# Patient Record
Sex: Female | Born: 1970 | Race: Black or African American | Hispanic: No | Marital: Married | Smoking: Never smoker
Health system: Southern US, Community
[De-identification: ages and names within clinical notes are randomized; demographics above are authoritative.]

## PROBLEM LIST (undated history)

## (undated) DIAGNOSIS — J45909 Unspecified asthma, uncomplicated: Secondary | ICD-10-CM

## (undated) DIAGNOSIS — B20 Human immunodeficiency virus [HIV] disease: Secondary | ICD-10-CM

## (undated) HISTORY — PX: VAGINAL HYSTERECTOMY: SUR661

---

## 2000-07-19 ENCOUNTER — Other Ambulatory Visit: Admission: RE | Admit: 2000-07-19 | Discharge: 2000-07-19 | Payer: Self-pay | Admitting: Emergency Medicine

## 2001-04-04 ENCOUNTER — Encounter: Payer: Self-pay | Admitting: Emergency Medicine

## 2001-04-04 ENCOUNTER — Encounter: Admission: RE | Admit: 2001-04-04 | Discharge: 2001-04-04 | Payer: Self-pay | Admitting: Emergency Medicine

## 2001-11-18 ENCOUNTER — Emergency Department (HOSPITAL_COMMUNITY): Admission: EM | Admit: 2001-11-18 | Discharge: 2001-11-18 | Payer: Self-pay | Admitting: Emergency Medicine

## 2001-11-19 ENCOUNTER — Emergency Department (HOSPITAL_COMMUNITY): Admission: EM | Admit: 2001-11-19 | Discharge: 2001-11-20 | Payer: Self-pay | Admitting: Emergency Medicine

## 2003-04-22 ENCOUNTER — Emergency Department (HOSPITAL_COMMUNITY): Admission: EM | Admit: 2003-04-22 | Discharge: 2003-04-22 | Payer: Self-pay | Admitting: Emergency Medicine

## 2003-05-28 ENCOUNTER — Other Ambulatory Visit: Admission: RE | Admit: 2003-05-28 | Discharge: 2003-05-28 | Payer: Self-pay | Admitting: Family Medicine

## 2003-07-25 ENCOUNTER — Emergency Department (HOSPITAL_COMMUNITY): Admission: EM | Admit: 2003-07-25 | Discharge: 2003-07-25 | Payer: Self-pay | Admitting: Emergency Medicine

## 2004-05-23 ENCOUNTER — Other Ambulatory Visit: Admission: RE | Admit: 2004-05-23 | Discharge: 2004-05-23 | Payer: Self-pay | Admitting: Family Medicine

## 2004-12-13 ENCOUNTER — Encounter: Admission: RE | Admit: 2004-12-13 | Discharge: 2004-12-13 | Payer: Self-pay | Admitting: Family Medicine

## 2005-08-14 ENCOUNTER — Ambulatory Visit: Payer: Self-pay | Admitting: Gastroenterology

## 2005-08-15 ENCOUNTER — Ambulatory Visit: Payer: Self-pay | Admitting: Gastroenterology

## 2005-09-18 ENCOUNTER — Ambulatory Visit: Payer: Self-pay | Admitting: Gastroenterology

## 2007-04-16 ENCOUNTER — Other Ambulatory Visit: Admission: RE | Admit: 2007-04-16 | Discharge: 2007-04-16 | Payer: Self-pay | Admitting: Endocrinology

## 2007-07-01 ENCOUNTER — Ambulatory Visit: Payer: Self-pay | Admitting: Gastroenterology

## 2007-07-02 ENCOUNTER — Ambulatory Visit (HOSPITAL_COMMUNITY): Admission: RE | Admit: 2007-07-02 | Discharge: 2007-07-02 | Payer: Self-pay | Admitting: Gastroenterology

## 2007-09-02 ENCOUNTER — Encounter: Payer: Self-pay | Admitting: Physician Assistant

## 2007-09-02 ENCOUNTER — Telehealth: Payer: Self-pay | Admitting: Gastroenterology

## 2007-09-02 ENCOUNTER — Ambulatory Visit: Payer: Self-pay | Admitting: Internal Medicine

## 2007-09-02 DIAGNOSIS — R1013 Epigastric pain: Secondary | ICD-10-CM

## 2007-09-02 DIAGNOSIS — R110 Nausea: Secondary | ICD-10-CM

## 2007-09-02 DIAGNOSIS — J45909 Unspecified asthma, uncomplicated: Secondary | ICD-10-CM | POA: Insufficient documentation

## 2007-09-04 ENCOUNTER — Ambulatory Visit (HOSPITAL_COMMUNITY): Admission: RE | Admit: 2007-09-04 | Discharge: 2007-09-04 | Payer: Self-pay | Admitting: Internal Medicine

## 2007-09-05 ENCOUNTER — Telehealth: Payer: Self-pay | Admitting: Physician Assistant

## 2007-09-10 ENCOUNTER — Telehealth: Payer: Self-pay | Admitting: Gastroenterology

## 2007-09-10 LAB — CONVERTED CEMR LAB
ALT: 20 units/L (ref 0–35)
Albumin: 3.9 g/dL (ref 3.5–5.2)
BUN: 7 mg/dL (ref 6–23)
GFR calc Af Amer: 81 mL/min
GFR calc non Af Amer: 67 mL/min
Total Bilirubin: 0.8 mg/dL (ref 0.3–1.2)
Total Protein: 8 g/dL (ref 6.0–8.3)

## 2007-10-01 ENCOUNTER — Encounter: Payer: Self-pay | Admitting: Gastroenterology

## 2007-10-30 ENCOUNTER — Encounter (INDEPENDENT_AMBULATORY_CARE_PROVIDER_SITE_OTHER): Payer: Self-pay | Admitting: General Surgery

## 2007-10-30 ENCOUNTER — Ambulatory Visit (HOSPITAL_COMMUNITY): Admission: RE | Admit: 2007-10-30 | Discharge: 2007-10-31 | Payer: Self-pay | Admitting: General Surgery

## 2007-11-11 ENCOUNTER — Encounter: Payer: Self-pay | Admitting: Gastroenterology

## 2008-01-07 ENCOUNTER — Emergency Department (HOSPITAL_COMMUNITY): Admission: EM | Admit: 2008-01-07 | Discharge: 2008-01-08 | Payer: Self-pay | Admitting: Emergency Medicine

## 2008-02-13 ENCOUNTER — Ambulatory Visit: Payer: Self-pay | Admitting: Psychiatry

## 2008-02-13 ENCOUNTER — Inpatient Hospital Stay (HOSPITAL_COMMUNITY): Admission: RE | Admit: 2008-02-13 | Discharge: 2008-02-14 | Payer: Self-pay | Admitting: Psychiatry

## 2008-02-13 ENCOUNTER — Emergency Department (HOSPITAL_COMMUNITY): Admission: EM | Admit: 2008-02-13 | Discharge: 2008-02-13 | Payer: Self-pay | Admitting: Emergency Medicine

## 2008-02-16 ENCOUNTER — Emergency Department: Payer: Self-pay | Admitting: Emergency Medicine

## 2008-05-25 ENCOUNTER — Emergency Department (HOSPITAL_COMMUNITY): Admission: EM | Admit: 2008-05-25 | Discharge: 2008-05-25 | Payer: Self-pay | Admitting: Emergency Medicine

## 2008-08-17 ENCOUNTER — Emergency Department: Payer: Self-pay | Admitting: Emergency Medicine

## 2009-01-20 ENCOUNTER — Other Ambulatory Visit: Admission: RE | Admit: 2009-01-20 | Discharge: 2009-01-20 | Payer: Self-pay | Admitting: Family Medicine

## 2009-11-02 ENCOUNTER — Ambulatory Visit (HOSPITAL_COMMUNITY): Admission: RE | Admit: 2009-11-02 | Discharge: 2009-11-02 | Payer: Self-pay | Admitting: Psychiatry

## 2009-11-09 ENCOUNTER — Other Ambulatory Visit (HOSPITAL_COMMUNITY): Admission: RE | Admit: 2009-11-09 | Discharge: 2009-11-26 | Payer: Self-pay | Admitting: Psychiatry

## 2009-11-18 ENCOUNTER — Ambulatory Visit: Payer: Self-pay | Admitting: Psychiatry

## 2010-08-02 LAB — COMPREHENSIVE METABOLIC PANEL
BUN: 11 mg/dL (ref 6–23)
CO2: 29 mEq/L (ref 19–32)
Calcium: 8.8 mg/dL (ref 8.4–10.5)
Chloride: 105 mEq/L (ref 96–112)
Creatinine, Ser: 0.84 mg/dL (ref 0.4–1.2)
GFR calc non Af Amer: >60 mL/min (ref >60)
Total Bilirubin: 0.6 mg/dL (ref 0.3–1.2)

## 2010-08-02 LAB — CBC
HCT: 37.6 % (ref 36.0–46.0)
MCHC: 33 g/dL (ref 30.0–36.0)
MCV: 88.4 fL (ref 78.0–100.0)
Platelets: 302 10*3/uL (ref 150–400)
WBC: 7.2 10*3/uL (ref 4.0–10.5)

## 2010-08-02 LAB — DIFFERENTIAL
Basophils Absolute: 0 10*3/uL (ref 0.0–0.1)
Lymphocytes Relative: 22 % (ref 12–46)
Lymphs Abs: 1.6 10*3/uL (ref 0.7–4.0)
Neutro Abs: 4.8 10*3/uL (ref 1.7–7.7)

## 2010-08-02 LAB — LIPASE, BLOOD: Lipase: 22 U/L (ref 11–59)

## 2010-08-30 NOTE — Assessment & Plan Note (Signed)
[facility]                         GASTROENTEROLOGY OFFICE NOTE   NAME:Lauren Salinas, Lauren Salinas                    MRN:          [id number]  DATE:07/01/2007                            DOB:          1970-09-09    Mrs. Lauren Salinas has had worsening problems with epigastric pain.  She had  an episode of nausea and vomiting about 2 weeks ago, but this was self  limited.  She has remained on Excedrin and Aleve.  She states she has  been taking Aleve more frequently due to worsening problems with back  pain.  She notes worsening epigastric pain with the regular use of  Excedrin and Aleve.  She has a history of gastritis on endoscopy  approximately 2 years ago.  She has no dysphagia, odynophagia, melena,  or hematochezia.  Helicobacter pylori antibody in June 2007 was  negative.   CURRENT MEDICATIONS:  Listed on the chart, updated, and reviewed.   MEDICATION ALLERGIES:  None known.   EXAM:  Obese African-American female in no acute distress.  Weight 209.8 pounds, blood pressure 110/80, pulse 86 and regular.  HEENT:  Anicteric sclerae, oropharynx clear.  CHEST:  Clear to auscultation bilaterally.  CARDIAC:  Regular rate and rhythm without murmurs.  ABDOMEN:  Soft with mild epigastric tenderness to deep palpation.  No  rebound or guarding.  No palpable organomegaly, masses, or hernias.  Normoactive bowel sounds.  RECTAL:  Examination deferred with the recent exam performed by Lou Miner, NP, showing no lesions and Hemoccult negative stool.   ASSESSMENT AND PLAN:  Worsening epigastric pain.  I suspect she has  recurrent gastritis likely related to aspirin and NSAID usage.  She is  to minimize or avoid all aspirin and NSAID, and change to Tylenol.  Further treatment of her back pain and other medical problems per her  primary care physician.  Increase omeprazole to 20 mg p.o. b.i.d.  Schedule abdominal ultrasound to rule out cholelithiasis.  Return office  visit with me in 4 to 6 weeks.     Venita Lick. Russella Dar, MD, [facility]  Electronically Signed    MTS/MedQ  DD: 07/01/2007  DT: 07/01/2007  Job #: [id number]   cc:   Lou Miner, NP, [facility] @ [facility]
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
## 2010-08-30 NOTE — Op Note (Signed)
Lauren Salinas, Lauren Salinas             ACCOUNT NO.:  [id number]   MEDICAL RECORD NO.:  [id number]          PATIENT TYPE:  AMB   LOCATION:  DAY                          FACILITY:  [facility]   PHYSICIAN:  Adolph Pollack, M.D.DATE OF BIRTH:  07-Jan-1971   DATE OF PROCEDURE:  10/30/2007  DATE OF DISCHARGE:                               OPERATIVE REPORT   PREOPERATIVE DIAGNOSIS:  Biliary dyskinesia.   POSTOPERATIVE DIAGNOSIS:  Biliary dyskinesia.   PROCEDURE:  Laparoscopic cholecystectomy with intraoperative  cholangiogram.   SURGEON:  Adolph Pollack, M.D.   ASSISTANT:  Leonie Man, M.D.   ANESTHESIA:  General.   INDICATIONS:  This is a 40 year old female who is having epigastric  pressure-type pain, nausea specifically after eating up.  She had been  on double dose proton pump inhibitors and had to take some Aleve.  She  stopped her Aleve and despite being on the proton pump inhibitors she  continues to have the postprandial pressure-type pain.  She has  gallbladder dysfunction consistent with biliary dyskinesia and now  presents for cholecystectomy.  The procedure, risks and success rate  were discussed with her preoperatively.   TECHNIQUE:  She was seen in the holding area and then brought to the  operating room, placed supine on the operating table and a general  anesthetic was administered.  The abdominal wall was sterilely prepped  and draped.  Marcaine solution was infiltrated in the subumbilical  region.  A previous small transverse subumbilical incision was reincised  through the skin and subcutaneous tissue until the midline fascia was  identified.  An incision was made in the midline fascia and peritoneum  entering the peritoneal cavity under direct vision.  A pursestring  suture of 0 Vicryl was placed around the fascial edges.  A Hasson trocar  was introduced to the peritoneal cavity and pneumoperitoneum created by  insufflation of CO2 gas.   Next the laparoscope  was introduced.  She was then placed in the reverse  Trendelenburg position with the right side tilted slightly up.  An 11-mm  trocar was placed through an epigastric incision and two 5 mm trocars  placed in the right mid lateral abdomen.   Following this the fundus of the gallbladder was grasped and no acute  inflammatory changes were noted.  There appeared to be some adhesions  between the dome of the liver and the diaphragm.  They were very thin.  I then grasped the infundibulum, retracted it laterally and mobilized  the infundibulum staying close to the gallbladder.  The cystic duct was  identified as well as the cystic artery that was adjacent to it.  I  separated these two with blunt dissection and created windows around  both of them.  A clip was placed at the cystic duct gallbladder junction  and a small incision made in the cystic duct.  A cholangiocatheter was  passed through the anterior abdominal wall into the cystic duct and  cholangiogram was performed.   Under real time fluoroscopy dilute contrast was injected into the cystic  duct which was of moderate length.  Common hepatic, right  and left  hepatic and common bile ducts opacified with contrast drained into the  duodenum without obvious evidence of obstruction.  Final reports pending  the radiologist's interpretation.   The cholangiocatheter was removed, the cystic duct was then clipped  three times on the biliary side and divided.  The cystic artery was then  clipped and divided.  The gallbladder was dissected free from liver  using electrocautery.  The posterior branch of the cystic artery was  identified and it was clipped.  The gallbladder then placed in Endopouch  bag.   The gallbladder fossa was copiously irrigated with saline solution.  There was no bleeding or bile leak noted.  The gallbladder was then  removed in the Endopouch bag through the subumbilical port.  The  subumbilical fascial defect was closed by  tightening up and tying down  the pursestring suture.  Remaining irrigation fluid was evacuated.  Trocars were removed and the pneumoperitoneum was released.   Skin incisions were closed with 4-0 Monocryl subcuticular stitches  followed by Steri-Strips and a sterile dressing.  She tolerated the  procedure well without any apparent complications and was taken to the  recovery in satisfactory condition.      Adolph Pollack, M.D.  Electronically Signed     TJR/MEDQ  D:  10/30/2007  T:  10/30/2007  Job:  [id number]   cc:   Venita Lick. Russella Dar, MD, FACG  [address]  [place]  [place] [address]

## 2011-01-12 LAB — DIFFERENTIAL
Eosinophils Absolute: 0.2
Eosinophils Relative: 3
Lymphs Abs: 2.8

## 2011-01-12 LAB — COMPREHENSIVE METABOLIC PANEL
ALT: 16
AST: 16
Alkaline Phosphatase: 56
CO2: 28
Calcium: 9.2
Chloride: 99
GFR calc Af Amer: >60
GFR calc non Af Amer: 55 — ABNORMAL LOW
Potassium: 4.3
Sodium: 134 — ABNORMAL LOW
Total Bilirubin: 0.8

## 2011-01-12 LAB — CBC
RBC: 4.75
WBC: 7.6

## 2011-01-12 LAB — PREGNANCY, URINE: Preg Test, Ur: NEGATIVE

## 2011-01-16 LAB — DIFFERENTIAL
Eosinophils Absolute: 0.2
Eosinophils Relative: 3
Lymphs Abs: 3.5
Monocytes Relative: 9

## 2011-01-16 LAB — CBC
HCT: 38.6
Hemoglobin: 12.8
MCV: 87
RBC: 4.44
WBC: 7.6

## 2011-01-16 LAB — BASIC METABOLIC PANEL
BUN: 9
Chloride: 104
GFR calc Af Amer: >60
Potassium: 3.3 — ABNORMAL LOW

## 2011-01-16 LAB — URINALYSIS, ROUTINE W REFLEX MICROSCOPIC
Glucose, UA: NEGATIVE
Ketones, ur: NEGATIVE
Protein, ur: NEGATIVE

## 2011-01-16 LAB — URINE MICROSCOPIC-ADD ON

## 2011-01-17 LAB — DIFFERENTIAL
Basophils Relative: 1
Eosinophils Relative: 4
Lymphocytes Relative: 50 — ABNORMAL HIGH
Monocytes Relative: 23 — ABNORMAL HIGH
Neutro Abs: 0.7 — ABNORMAL LOW
Neutrophils Relative %: 22 — ABNORMAL LOW

## 2011-01-17 LAB — URINE MICROSCOPIC-ADD ON

## 2011-01-17 LAB — URINALYSIS, ROUTINE W REFLEX MICROSCOPIC
Glucose, UA: NEGATIVE
Ketones, ur: NEGATIVE
Protein, ur: NEGATIVE

## 2011-01-17 LAB — POCT I-STAT, CHEM 8
BUN: 7
Chloride: 104
Creatinine, Ser: 1.3 — ABNORMAL HIGH
Sodium: 141
TCO2: 27

## 2011-01-17 LAB — RAPID URINE DRUG SCREEN, HOSP PERFORMED
Cocaine: NOT DETECTED
Opiates: POSITIVE — AB

## 2011-01-17 LAB — PATHOLOGIST SMEAR REVIEW

## 2011-01-17 LAB — POCT PREGNANCY, URINE: Preg Test, Ur: NEGATIVE

## 2011-01-17 LAB — CBC
Hemoglobin: 13.2
RBC: 4.59
WBC: 3.3 — ABNORMAL LOW

## 2013-11-08 ENCOUNTER — Emergency Department (HOSPITAL_COMMUNITY): Payer: BC Managed Care – PPO

## 2013-11-08 ENCOUNTER — Other Ambulatory Visit (HOSPITAL_COMMUNITY): Payer: BC Managed Care – PPO

## 2013-11-08 ENCOUNTER — Emergency Department (HOSPITAL_COMMUNITY)
Admission: EM | Admit: 2013-11-08 | Discharge: 2013-11-08 | Disposition: A | Discharge status: Home / Self Care | Payer: BC Managed Care – PPO | Attending: Emergency Medicine | Admitting: Emergency Medicine

## 2013-11-08 ENCOUNTER — Encounter (HOSPITAL_COMMUNITY): Payer: Self-pay | Admitting: Emergency Medicine

## 2013-11-08 DIAGNOSIS — R079 Chest pain, unspecified: Secondary | ICD-10-CM | POA: Insufficient documentation

## 2013-11-08 DIAGNOSIS — M25519 Pain in unspecified shoulder: Secondary | ICD-10-CM | POA: Insufficient documentation

## 2013-11-08 DIAGNOSIS — R748 Abnormal levels of other serum enzymes: Secondary | ICD-10-CM | POA: Insufficient documentation

## 2013-11-08 DIAGNOSIS — J45909 Unspecified asthma, uncomplicated: Secondary | ICD-10-CM | POA: Insufficient documentation

## 2013-11-08 DIAGNOSIS — R1011 Right upper quadrant pain: Secondary | ICD-10-CM

## 2013-11-08 DIAGNOSIS — M549 Dorsalgia, unspecified: Secondary | ICD-10-CM | POA: Insufficient documentation

## 2013-11-08 DIAGNOSIS — Z79899 Other long term (current) drug therapy: Secondary | ICD-10-CM | POA: Insufficient documentation

## 2013-11-08 DIAGNOSIS — Z21 Asymptomatic human immunodeficiency virus [HIV] infection status: Secondary | ICD-10-CM | POA: Insufficient documentation

## 2013-11-08 DIAGNOSIS — R1012 Left upper quadrant pain: Secondary | ICD-10-CM | POA: Insufficient documentation

## 2013-11-08 HISTORY — DX: Unspecified asthma, uncomplicated: J45.909

## 2013-11-08 HISTORY — DX: Human immunodeficiency virus (HIV) disease: B20

## 2013-11-08 LAB — CBC WITH DIFFERENTIAL/PLATELET
BASOS ABS: 0 10*3/uL (ref 0.0–0.1)
Basophils Relative: 0 % (ref 0–1)
EOS ABS: 0.1 10*3/uL (ref 0.0–0.7)
EOS PCT: 1 % (ref 0–5)
HEMATOCRIT: 39 % (ref 36.0–46.0)
Hemoglobin: 12.8 g/dL (ref 12.0–15.0)
Lymphocytes Relative: 26 % (ref 12–46)
Lymphs Abs: 2.2 10*3/uL (ref 0.7–4.0)
MCH: 29.6 pg (ref 26.0–34.0)
MCHC: 32.8 g/dL (ref 30.0–36.0)
MCV: 90.3 fL (ref 78.0–100.0)
MONO ABS: 0.5 10*3/uL (ref 0.1–1.0)
Monocytes Relative: 6 % (ref 3–12)
Neutro Abs: 5.4 10*3/uL (ref 1.7–7.7)
Neutrophils Relative %: 67 % (ref 43–77)
PLATELETS: 239 10*3/uL (ref 150–400)
RBC: 4.32 MIL/uL (ref 3.87–5.11)
RDW: 12.5 % (ref 11.5–15.5)
WBC: 8.2 10*3/uL (ref 4.0–10.5)

## 2013-11-08 LAB — COMPREHENSIVE METABOLIC PANEL
ALT: 57 U/L — AB (ref 0–35)
AST: 98 U/L — AB (ref 0–37)
Albumin: 3.7 g/dL (ref 3.5–5.2)
Alkaline Phosphatase: 80 U/L (ref 39–117)
Anion gap: 14 (ref 5–15)
BUN: 11 mg/dL (ref 6–23)
CALCIUM: 9.1 mg/dL (ref 8.4–10.5)
CO2: 24 mEq/L (ref 19–32)
CREATININE: 0.95 mg/dL (ref 0.50–1.10)
Chloride: 101 mEq/L (ref 96–112)
GFR, EST AFRICAN AMERICAN: 85 mL/min — AB (ref >90)
GFR, EST NON AFRICAN AMERICAN: 73 mL/min — AB (ref >90)
GLUCOSE: 122 mg/dL — AB (ref 70–99)
Potassium: 3.5 mEq/L — ABNORMAL LOW (ref 3.7–5.3)
SODIUM: 139 meq/L (ref 137–147)
TOTAL PROTEIN: 7.5 g/dL (ref 6.0–8.3)
Total Bilirubin: 0.4 mg/dL (ref 0.3–1.2)

## 2013-11-08 LAB — LIPASE, BLOOD: LIPASE: 17 U/L (ref 11–59)

## 2013-11-08 LAB — AMYLASE: Amylase: 40 U/L (ref 0–105)

## 2013-11-08 MED ORDER — HYDROMORPHONE HCL PF 1 MG/ML IJ SOLN
0.5000 mg | Freq: Once | INTRAMUSCULAR | Status: AC
  Administered 2013-11-08: 0.05 mg via INTRAVENOUS
  Filled 2013-11-08: qty 1

## 2013-11-08 MED ORDER — IOHEXOL 300 MG/ML  SOLN
25.0000 mL | INTRAMUSCULAR | Status: DC | PRN
  Administered 2013-11-08: 25 mL via ORAL

## 2013-11-08 MED ORDER — OXYCODONE-ACETAMINOPHEN 5-325 MG PO TABS
1.0000 | ORAL_TABLET | Freq: Once | ORAL | Status: DC
  Filled 2013-11-08: qty 1

## 2013-11-08 MED ORDER — ONDANSETRON HCL 4 MG/2ML IJ SOLN
4.0000 mg | Freq: Once | INTRAMUSCULAR | Status: AC
  Administered 2013-11-08: 4 mg via INTRAVENOUS
  Filled 2013-11-08: qty 2

## 2013-11-08 MED ORDER — ONDANSETRON HCL 4 MG/2ML IJ SOLN
4.0000 mg | Freq: Once | INTRAMUSCULAR | Status: DC
Start: 2013-11-08 — End: 2013-11-08
  Filled 2013-11-08: qty 2

## 2013-11-08 MED ORDER — PANTOPRAZOLE SODIUM 40 MG IV SOLR
40.0000 mg | Freq: Once | INTRAVENOUS | Status: AC
  Administered 2013-11-08: 40 mg via INTRAVENOUS
  Filled 2013-11-08: qty 40

## 2013-11-08 MED ORDER — HYDROMORPHONE HCL PF 1 MG/ML IJ SOLN
1.0000 mg | Freq: Once | INTRAMUSCULAR | Status: AC
  Administered 2013-11-08: 1 mg via INTRAVENOUS
  Filled 2013-11-08: qty 1

## 2013-11-08 MED ORDER — ONDANSETRON HCL 4 MG PO TABS: 4.0000 mg | ORAL_TABLET | Freq: Four times a day (QID) | ORAL | Status: AC

## 2013-11-08 MED ORDER — HYDROMORPHONE HCL PF 1 MG/ML IJ SOLN
1.0000 mg | Freq: Once | INTRAMUSCULAR | Status: AC | PRN
  Administered 2013-11-08: 1 mg via INTRAVENOUS
  Filled 2013-11-08: qty 1

## 2013-11-08 MED ORDER — IOHEXOL 300 MG/ML  SOLN
100.0000 mL | Freq: Once | INTRAMUSCULAR | Status: AC | PRN
  Administered 2013-11-08: 100 mL via INTRAVENOUS

## 2013-11-08 MED ORDER — OXYCODONE HCL 5 MG PO TABA: 5.0000 mg | ORAL_TABLET | Freq: Four times a day (QID) | ORAL | Status: AC | PRN

## 2013-11-08 NOTE — ED Notes (Signed)
Pt. Stated, I was awken by abd. Pain that hurt so bad went to my chest, shoulder and back around 0500

## 2013-11-08 NOTE — ED Notes (Signed)
CT reports CD of scan is not quite ready but will call us when it its.
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
## 2013-11-08 NOTE — ED Provider Notes (Signed)
Medical screening examination/treatment/procedure(s) were performed by non-physician practitioner and as supervising physician I was immediately available for consultation/collaboration.   EKG Interpretation None        Joya Gaskinsonald W Marda Breidenbach, MD 11/08/13 [phone]

## 2013-11-08 NOTE — Discharge Instructions (Signed)
DO NOT TAKE MEDICATIONS THAT HAVE TYLENOL (ACETOMINPHEN) IN IT.  Liver Profile A liver profile is a battery of tests which helps your caregiver evaluate your liver function. The following tests are often included in the liver profile: Alanine aminotransferase (ALT or SGPT) This is an enzyme found primarily in the liver. Abnormalities may represent liver disease. This is found in cells of the liver so when it is elevated, it has been released by damaged cells. Albumin - The serum albumin is one of the major proteins in the blood and a reflection of the general state of nutrition. This is low when the liver is unable to do its job. It is also low when protein is lost in the urine. NORMAL FINDINGS Adult/Elderly  Total protein: 6.4-8.3 g/dL or 82-9564-83 g/L (SI units)  Albumin: 3.5-5 g/dL or 62-1335-50 g/L (SI units)  Globulin: 2.3-3.4 g/dL  Alpha1 globulin: 0.8-6.50.1-0.3 g/dL or 1-3 g/L (SI units)  Alpha2 globulin: 0.6-1 g/dL or 7-846-10 g/L (SI units)  Beta globulin: 0.7-1.1 g/dL or 6-967-11 g/L (SI units) Children  Total protein  Premature infant: 4.2-7.6 g/dL  Newborn: 2.9-5.24.6-7.4 g/dL  Infant: 8-4.16-6.7 g/dL  Child: 3.2-46.2-8 g/dL  Albumin  Premature infant: 3-4.2 g/dL  Newborn: 4.0-1.03.5-5.4 g/dL  Infant: 2.7-2.54.4-5.4 g/dL  Child: 3-6.64-5.9 g/dL Albumin/Globulin ratio - Calculated by dividing the albumin by the globulin. It is a measure of well being.  Alkaline phosphatase - This is an enzyme which is important in diagnosing proper bone and liver functions. NORMAL FINDINGS Age / Normal Value (units/L)  0-5 days / 35-140  Less than 3 yr / 15-60  3-6 yr / 15-50  6-12 yr / 10-50  12-18 yr / 10-40  Adult / 0-35 units/L or 0-0.58 microKat/L (SI Units) (Females tend to have slightly lower levels than males)  Elderly / Slightly higher than adults Aspartate aminotransferase (AST or SGOT) - an enzyme found in skeletal and heart muscle, liver and other organs. Abnormalities may represent liver disease. This is  found in cells of the liver so when it is elevated, it has been released by damaged cells. Bilirubin, Total: A chemical involved with liver functions. High concentrations may result in jaundice. Jaundice is a yellowing of the skin and the whites of the eyes. NORMAL FINDINGS Blood  Adult/elderly/child  Total bilirubin: 0.3-1.0 mg/dL or 4.4-035.1-17 micromole/L (SI units)  Indirect bilirubin: 0.2-0.8 mg/dL or 4.7-42.53.4-12.0 micromole/L (SI units)  Direct bilirubin: 0.1-0.3 mg/dL or 9.5-6.31.7-5.1 micromole/L (SI units)  Newborn total bilirubin: 1.0-12.0 mg/dL or 87.5-64317.1-205 micromole/L (SI units)  Urine0-0.02 mg/dL Ranges for normal findings may vary among different laboratories and hospitals. You should always check with your doctor after having lab work or other tests done to discuss the meaning of your test results and whether your values are considered within normal limits PREPARATION FOR TEST No preparation or fasting is necessary unless you have been informed otherwise. A blood sample is obtained by inserting a needle into a vein in the arm. MEANING OF TEST  Your caregiver will go over the test results with you and discuss the importance and meaning of your results, as well as treatment options and the need for additional tests if necessary. OBTAINING THE TEST RESULTS It is your responsibility to obtain your test results. Ask the lab or department performing the test when and how you will get your results. Document Released: 05/06/2004 Document Revised: 06/26/2011 Document Reviewed: 08/05/2013 [facility] Patient Information 2015 [place], [place]. This information is not intended to replace advice given to you  by your health care provider. Make sure you discuss any questions you have with your health care provider. ° °

## 2013-11-08 NOTE — ED Notes (Signed)
Pt finished drinking contrast. CT notified. 

## 2013-11-08 NOTE — ED Notes (Signed)
Tiffany, PA at bedside.

## 2014-10-05 IMAGING — CT CT ABD-PELV W/ CM
2 of 5 series · 11 of 46 positions shown, 12 images · IV contrast (Iodine)
Comparison: 07/02/2007

CLINICAL DATA: Severe abdominal pain, HIV, asthma, prior
hysterectomy

EXAM:
CT ABDOMEN AND PELVIS WITH CONTRAST
TECHNIQUE: Multidetector CT imaging of the abdomen and pelvis was performed
using the standard protocol following bolus administration of
intravenous contrast.
CONTRAST:  100mL OMNIPAQUE IOHEXOL 300 MG/ML  SOLN

[Series 201: routine, idose (2) · axial · 0.73mm/px · z∈[-468,-43]mm · 8 of 105 slices shown, 9 images]
[im 10/105  soft-tissue]
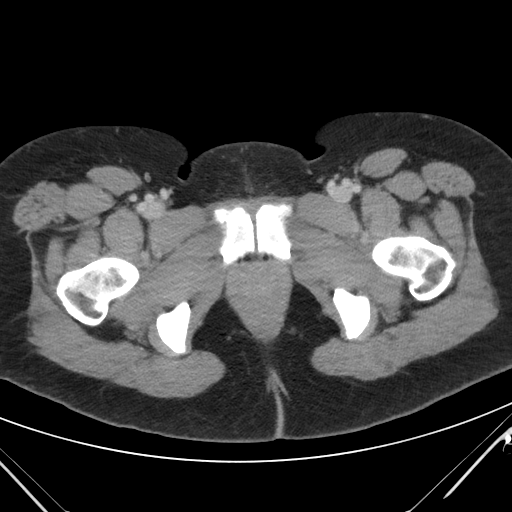
[im 10/105  bone]
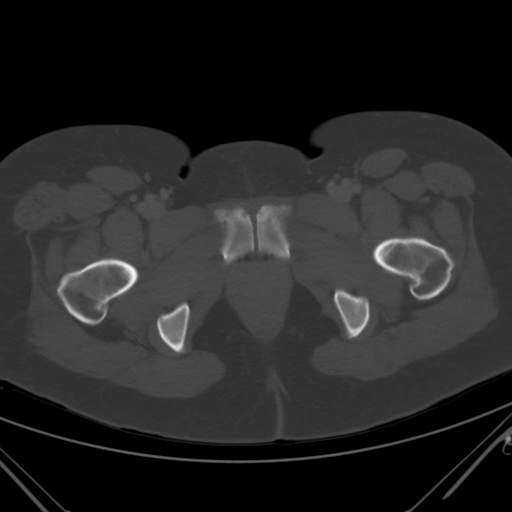
[im 20/105  soft-tissue]
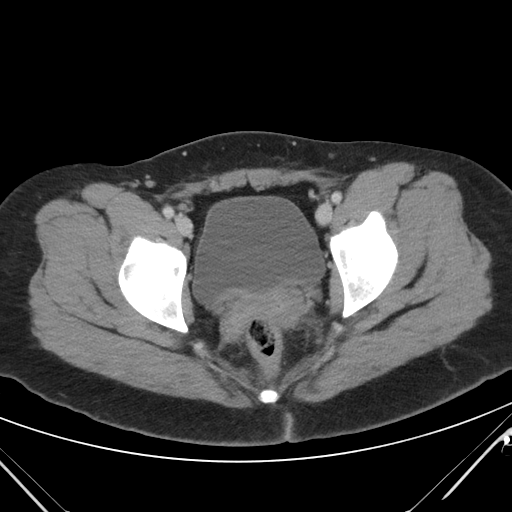
[im 35/105  soft-tissue]
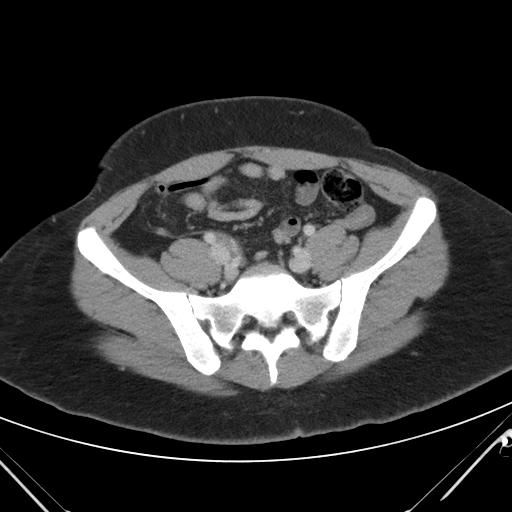
[im 45/105  soft-tissue]
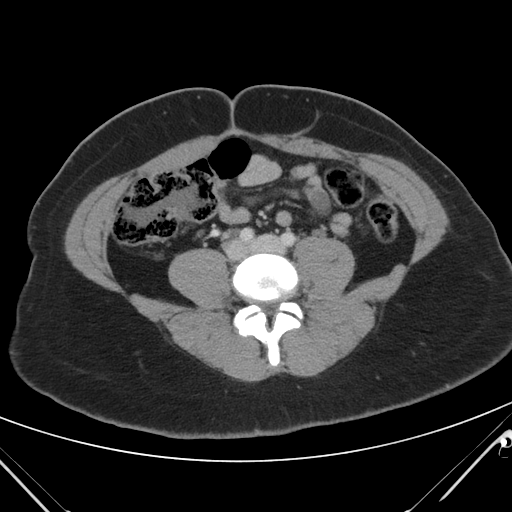
[im 60/105  soft-tissue]
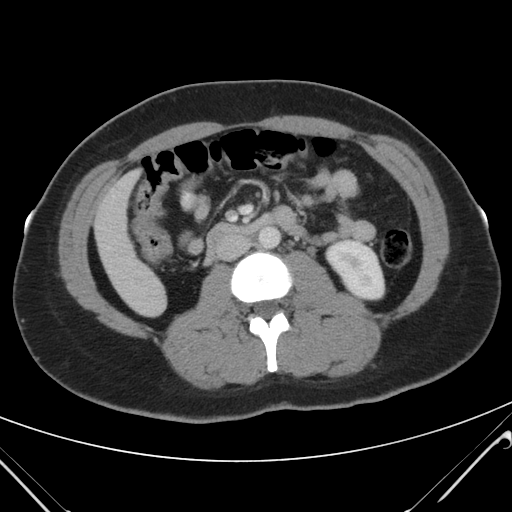
[im 70/105  soft-tissue]
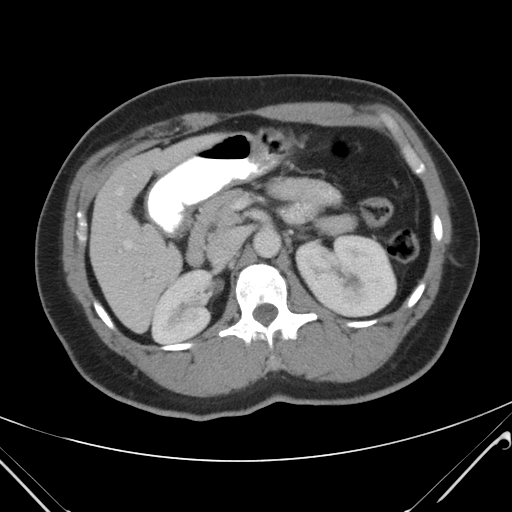
[im 85/105  soft-tissue]
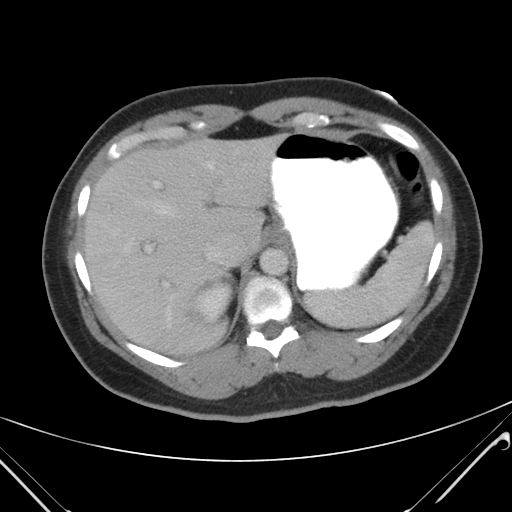
[im 95/105  soft-tissue]
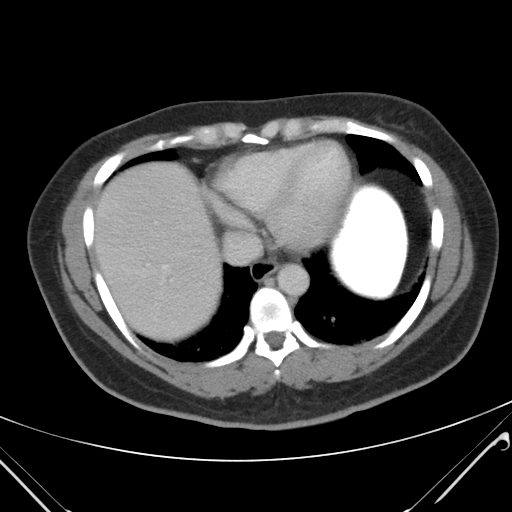

[Series 203: coronals, idose (2) · coronal · 0.50mm/px · 3 of 121 slices shown]
[im 41/121  soft-tissue]
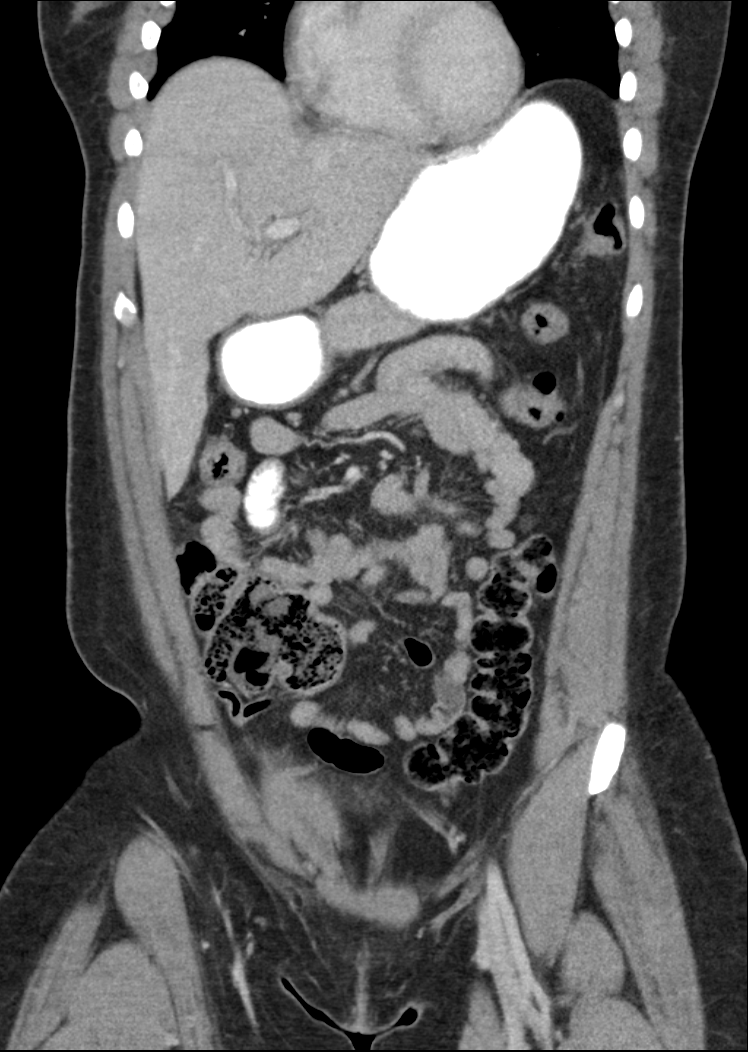
[im 54/121  soft-tissue]
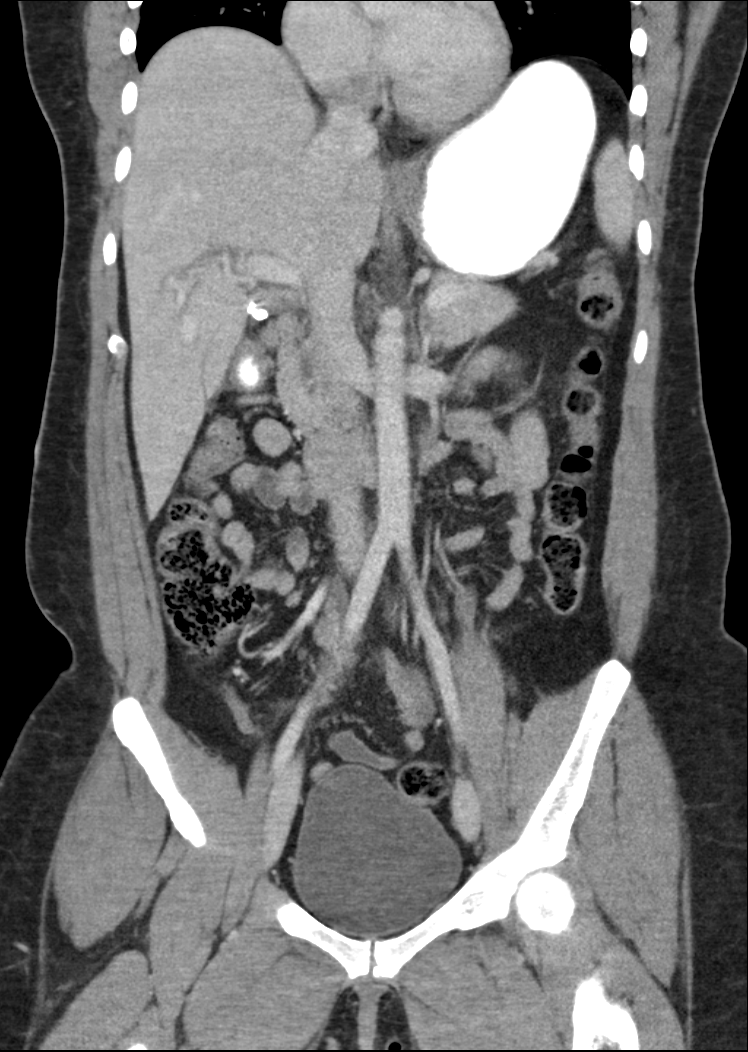
[im 67/121  soft-tissue]
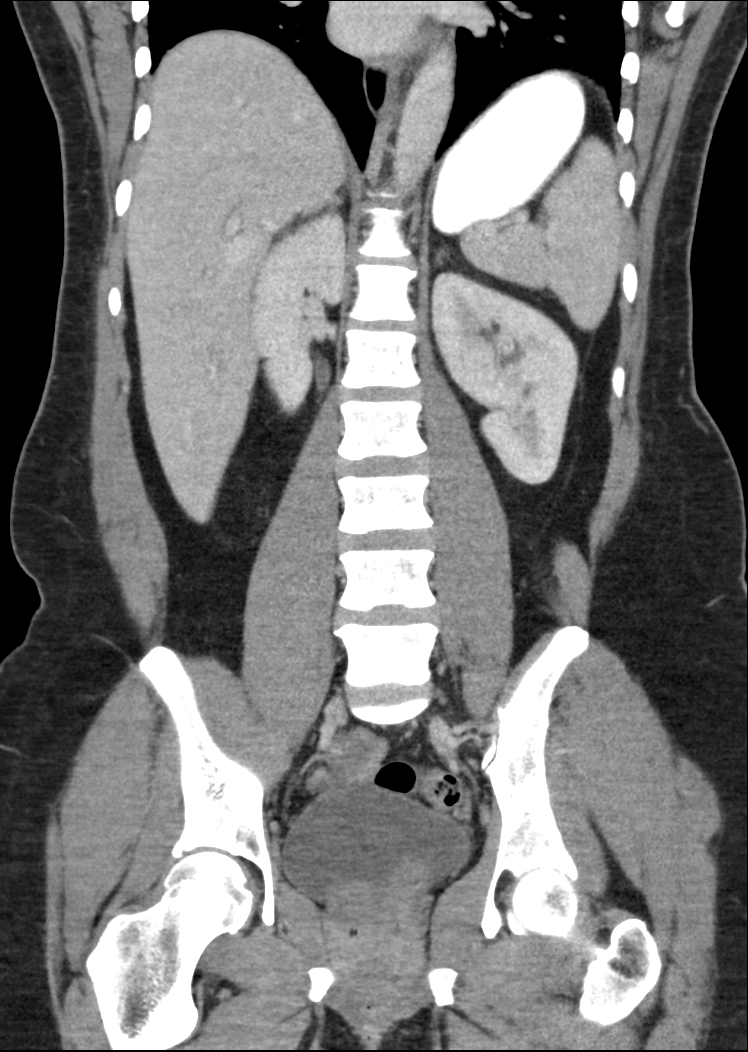

[11 of 46 positions shown; findings below may reference images not displayed]

FINDINGS: Dependent bibasilar atelectasis. Normal heart size. No pericardial
or pleural effusion. No hiatal hernia.

Abdomen: Prior cholecystectomy noted. Diffuse periportal edema
within the liver, nonspecific. No associated biliary dilatation.
Hepatic and portal veins are patent. No definite focal hepatic
abnormality. Recommend correlation with LFTs.

Biliary system, pancreas, spleen, adrenal glands, and kidneys are
within normal limits for age and demonstrate no acute process.
Incidental subcapsular splenic 8 mm hypodensity peripherally, image
26 suspect small splenic cyst.

Negative for bowel obstruction, dilatation, ileus, or free air. No
abdominal free fluid, fluid collection, hemorrhage, abscess, or
adenopathy. Ingested oral contrast remains in the stomach. Small
amount within small bowel.

Normal appendix demonstrated.

Normal caliber aorta. Negative for aneurysm or dissection. No
retroperitoneal abnormality.

Pelvis: Prior hysterectomy. Trace pelvic free fluid. Right adnexal
peripherally enhancing collapsing cyst measures 15 mm, image 79. no
acute distal bowel process. No pelvic fluid collection, abscess,
hemorrhage, hematoma, adenopathy, inguinal abnormality, or hernia.
Urinary bladder unremarkable.

No acute osseous finding.
IMPRESSION: Nonspecific diffuse periportal edema within the liver, can be seen
with mild hepatitis. Recommend correlation LFTs.

No associated biliary obstruction or dilatation.

Prior cholecystectomy and hysterectomy

Bibasilar atelectasis

Right adnexal likely ovarian collapsing cyst or follicle with trace
pelvic free fluid suspect physiologic
